# Patient Record
Sex: Female | Born: 1995 | Race: Black or African American | Hispanic: No | Marital: Single | Smoking: Never smoker
Health system: Southern US, Community
[De-identification: ages and names within clinical notes are randomized; demographics above are authoritative.]

## PROBLEM LIST (undated history)

## (undated) DIAGNOSIS — J45909 Unspecified asthma, uncomplicated: Secondary | ICD-10-CM

---

## 2017-08-31 ENCOUNTER — Other Ambulatory Visit: Payer: Self-pay

## 2017-08-31 ENCOUNTER — Emergency Department (HOSPITAL_COMMUNITY)
Admission: EM | Admit: 2017-08-31 | Discharge: 2017-08-31 | Disposition: A | Discharge status: Home / Self Care | Attending: Emergency Medicine | Admitting: Emergency Medicine

## 2017-08-31 ENCOUNTER — Encounter (HOSPITAL_COMMUNITY): Payer: Self-pay | Admitting: Emergency Medicine

## 2017-08-31 ENCOUNTER — Emergency Department (HOSPITAL_COMMUNITY)

## 2017-08-31 DIAGNOSIS — R0789 Other chest pain: Secondary | ICD-10-CM | POA: Diagnosis not present

## 2017-08-31 DIAGNOSIS — R079 Chest pain, unspecified: Secondary | ICD-10-CM

## 2017-08-31 LAB — I-STAT TROPONIN, ED: Troponin i, poc: 0 ng/mL (ref 0.00–0.08)

## 2017-08-31 LAB — I-STAT BETA HCG BLOOD, ED (MC, WL, AP ONLY): I-stat hCG, quantitative: <5 m[IU]/mL (ref <5)

## 2017-08-31 MED ORDER — OMEPRAZOLE 20 MG PO CPDR: 20.0000 mg | DELAYED_RELEASE_CAPSULE | Freq: Every day | ORAL | 0 refills | Status: DC

## 2017-08-31 NOTE — ED Notes (Signed)
Bed: WLPT1 Expected date:  Expected time:  Means of arrival:  Comments: 

## 2017-08-31 NOTE — ED Provider Notes (Signed)
7 date after I called [facility]-EMERGENCY DEPT Provider Note   CSN: [id number] Arrival date & time: 08/31/17  1447     History   Chief Complaint Chief Complaint  Patient presents with  . Chest Pain    HPI Charlotte Mcdaniel is a 22 y.o. female.  HPI Patient presents with chest pain.  Has had for the last 2 days.  Worse with laying down.  States it feels like it is up in her throat.  Also feels as if it is below her breasts.  No pain with exertion.  No fevers.  No cough.  No swelling in her legs.  No recent infection.  She is on a Nexplanon implant.  No lightheadedness or dizziness.  Has not had pains like this before.  No family history of early cardiac disease.  No swelling and his legs. History reviewed. No pertinent past medical history.  There are no active problems to display for this patient.   History reviewed. No pertinent surgical history.  OB History    No data available       Home Medications    Prior to Admission medications   Medication Sig Start Date End Date Taking? Authorizing Provider  etonogestrel (NEXPLANON) 68 MG IMPL implant 1 each by Subdermal route once.   Yes [provider]  ibuprofen (ADVIL,MOTRIN) 200 MG tablet Take 400 mg by mouth daily as needed.   Yes [provider]  omeprazole (PRILOSEC) 20 MG capsule Take 1 capsule (20 mg total) by mouth daily. 08/31/17   Benjiman CorePickering, Eberardo Demello, MD    Family History No family history on file.  Social History Social History   Tobacco Use  . Smoking status: Not on file  Substance Use Topics  . Alcohol use: Not on file  . Drug use: Not on file     Allergies   Patient has no known allergies.   Review of Systems Review of Systems  Constitutional: Negative for appetite change.  HENT: Negative for congestion.   Respiratory: Negative for shortness of breath.   Cardiovascular: Positive for chest pain.  Gastrointestinal: Negative for abdominal pain.  Genitourinary:  Negative for flank pain.  Musculoskeletal: Negative for back pain.  Neurological: Negative for seizures.  Psychiatric/Behavioral: Negative for confusion.     Physical Exam Updated Vital Signs BP 107/73 (BP Location: Right Arm)   Pulse 61   Temp 98.2 F (36.8 C) (Oral)   Resp 18   SpO2 100%   Physical Exam  Constitutional: She appears well-developed and well-nourished.  Non-toxic appearance. She does not appear ill.  HENT:  Head: Atraumatic.  Eyes: Pupils are equal, round, and reactive to light.  Neck: Neck supple.  Cardiovascular: Normal rate, regular rhythm and normal pulses. Exam reveals no gallop and no friction rub.  No murmur heard. Pulmonary/Chest: Effort normal and breath sounds normal.  Abdominal: There is no tenderness.  Musculoskeletal:       Right lower leg: She exhibits no edema.       Left lower leg: She exhibits no edema.  Neurological: She is alert.  Skin: Skin is warm. Capillary refill takes less than 2 seconds.  Psychiatric: She has a normal mood and affect.     ED Treatments / Results  Labs (all labs ordered are listed, but only abnormal results are displayed) Labs Reviewed  I-STAT TROPONIN, ED  I-STAT BETA HCG BLOOD, ED ([facility], [facility], [facility] ONLY)    EKG  EKG Interpretation  Date/Time:  Friday August 31 2017 14:57:29 EST Ventricular Rate:  75 PR Interval:    QRS Duration: 77 QT Interval:  363 QTC Calculation: 406 R Axis:   53 Text Interpretation:  Sinus rhythm Baseline wander in lead(s) V2 No old tracing to compare Confirmed by Shaune Pollack [phone]) on 08/31/2017 3:43:14 PM Also confirmed by Benjiman Core [phone])  on 08/31/2017 4:12:54 PM       Radiology Dg Chest 2 View  Result Date: 08/31/2017 CLINICAL DATA:  Chest pain for 2 days. EXAM: CHEST  2 VIEW COMPARISON:  None. FINDINGS: The heart size and mediastinal contours are within normal limits. Both lungs are clear. The visualized skeletal structures are unremarkable. IMPRESSION: No active  cardiopulmonary disease. Electronically Signed   By: Elsie Stain M.D.   On: 08/31/2017 15:13    Procedures Procedures (including critical care time)  Medications Ordered in ED Medications - No data to display   Initial Impression / Assessment and Plan / ED Course  I have reviewed the triage vital signs and the nursing notes.  Pertinent labs & imaging results that were available during my care of the patient were reviewed by me and considered in my medical decision making (see chart for details).     Patient with chest pain.  Dull in mid chest.  Worse with lying back improved by sitting up.  EKG reassuring.  No rub.  Negative troponin.  Negative x-ray.  Will discharge home.  Doubt cardiac ischemia.  Doubt pulmonary embolism.  Pericarditis considered  with the worsening laying back and improvement leaning forward.  Also reflux considered.  Will give anti-inflammatories and Prilosec and follow-up as needed.  Final Clinical Impressions(s) / ED Diagnoses   Final diagnoses:  Nonspecific chest pain    ED Discharge Orders        Ordered    omeprazole (PRILOSEC) 20 MG capsule  Daily     08/31/17 Marland Kitchen, MD 08/31/17 1851

## 2017-08-31 NOTE — Discharge Instructions (Signed)
Take some ibuprofen the next few days and see if that will help with the pain.  If pain does not improve may need further follow-up.  Potentially even with cardiology since the pain is worse with leaning forward.

## 2017-08-31 NOTE — ED Triage Notes (Signed)
Pt complaint of mid chest pain worse with laying down and breathing for 2 days; denies recent sickness.

## 2018-02-14 ENCOUNTER — Encounter (HOSPITAL_COMMUNITY): Payer: Self-pay | Admitting: Emergency Medicine

## 2018-02-14 ENCOUNTER — Ambulatory Visit (HOSPITAL_COMMUNITY)
Admission: EM | Admit: 2018-02-14 | Discharge: 2018-02-14 | Disposition: A | Discharge status: Home / Self Care | Attending: Family Medicine | Admitting: Family Medicine

## 2018-02-14 ENCOUNTER — Ambulatory Visit (INDEPENDENT_AMBULATORY_CARE_PROVIDER_SITE_OTHER)

## 2018-02-14 DIAGNOSIS — R1013 Epigastric pain: Secondary | ICD-10-CM

## 2018-02-14 DIAGNOSIS — R0789 Other chest pain: Secondary | ICD-10-CM

## 2018-02-14 MED ORDER — OMEPRAZOLE 20 MG PO CPDR: 20.0000 mg | DELAYED_RELEASE_CAPSULE | Freq: Every day | ORAL | 0 refills | Status: DC

## 2018-02-14 MED ORDER — OMEPRAZOLE 20 MG PO CPDR: 20.0000 mg | DELAYED_RELEASE_CAPSULE | Freq: Every day | ORAL | 0 refills | Status: AC

## 2018-02-14 NOTE — ED Notes (Signed)
Bed: UC01 Expected date: 02/14/18 Expected time:  Means of arrival:  Comments: Hold for APPTS

## 2018-02-14 NOTE — Discharge Instructions (Signed)
Begin prilosec daily for the next 2 weeks  Try gas-x over the counter  We will check the stool for infectious causes

## 2018-02-14 NOTE — ED Triage Notes (Signed)
Pt here for upper abd pain that feels like gas worse when laying flat x 1 week

## 2018-02-14 NOTE — ED Provider Notes (Signed)
[facility]    CSN: [id number] Arrival date & time: 02/14/18  1645     History   Chief Complaint Chief Complaint  Patient presents with  . Abdominal Pain    HPI Charlotte Mcdaniel is a 22 y.o. 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  Patient feels like there is a gas bubble in her upper abdomen, below her bra line that wraps around bilateral sides.  There is associated shortness of breath.  Also feels a sensation of a possible dog hair in her throat.  States she works at a [employer].  States that she notices this sensation more so when she is lying flat.  But will also occur when she is sitting upright.  Will recover from sleep.  She has had some nausea and some diarrhea, but has related this to her last menstrual cycle began on 7/12.  She states that she recently had the Nexplanon removed from her right upper arm in May.  Patient without discomfort at time of visit.  Last episode was yesterday.  HPI  History reviewed. No pertinent past medical history.  There are no active problems to display for this patient.   History reviewed. No pertinent surgical history.  OB History   None      Home Medications    Prior to Admission medications   Medication Sig Start Date End Date Taking? Authorizing Provider  etonogestrel (NEXPLANON) 68 MG IMPL implant 1 each by Subdermal route once.    [provider]  ibuprofen (ADVIL,MOTRIN) 200 MG tablet Take 400 mg by mouth daily as needed.    [provider]  omeprazole (PRILOSEC) 20 MG capsule Take 1 capsule (20 mg total) by mouth daily. 02/14/18   Saabir Blyth, Junius Creamer, PA-C    Family History History reviewed. No pertinent family history.  Social History Social History   Tobacco Use  . Smoking status: Not on file  Substance Use Topics  . Alcohol use: Not on file  . Drug use: Not on file     Allergies   Patient has no known  allergies.   Review of Systems Review of Systems  Constitutional: Negative for fatigue and fever.  HENT: Negative for congestion, sinus pressure and sore throat.   Eyes: Negative for photophobia, pain and visual disturbance.  Respiratory: Negative for cough and shortness of breath.   Cardiovascular: Positive for chest pain.  Gastrointestinal: Positive for abdominal pain, diarrhea and nausea. Negative for vomiting.  Genitourinary: Negative for decreased urine volume and hematuria.  Musculoskeletal: Negative for myalgias, neck pain and neck stiffness.  Neurological: Negative for dizziness, syncope, facial asymmetry, speech difficulty, weakness, light-headedness, numbness and headaches.     Physical Exam Triage Vital Signs ED Triage Vitals [02/14/18 1735]  Enc Vitals Group     BP 113/61     Pulse Rate 79     Resp 18     Temp 97.8 F (36.6 C)     Temp Source Oral     SpO2 98 %     Weight      Height      Head Circumference      Peak Flow      Pain Score      Pain Loc      Pain Edu?      Excl. in GC?    No data found.  Updated Vital Signs BP 113/61 (BP Location: Left Arm)   Pulse 79  Temp 97.8 F (36.6 C) (Oral)   Resp 18   SpO2 98%   Visual Acuity Right Eye Distance:   Left Eye Distance:   Bilateral Distance:    Right Eye Near:   Left Eye Near:    Bilateral Near:     Physical Exam  Constitutional: She is oriented to person, place, and time. She appears well-developed and well-nourished. No distress.  No acute distress  HENT:  Head: Normocephalic and atraumatic.  Nose: Nose normal.  Mouth/Throat: Oropharynx is clear and moist.  Eyes: Conjunctivae are normal.  Neck: Neck supple.  Cardiovascular: Normal rate and regular rhythm.  No murmur heard. Pulmonary/Chest: Effort normal and breath sounds normal. No respiratory distress.  Breathing comfortably at rest, CTABL, no wheezing, rales or other adventitious sounds auscultated  Abdominal: Soft. She exhibits  no distension. There is no tenderness.  Nontender light deep palpation throughout all 4 quadrants and epigastrium  Musculoskeletal: Normal range of motion. She exhibits no edema.  Chest discomfort not reproducible with palpation across anterior chest  Neurological: She is alert and oriented to person, place, and time.  Skin: Skin is warm and dry.  Psychiatric: She has a normal mood and affect.  Nursing note and vitals reviewed.    UC Treatments / Results  Labs (all labs ordered are listed, but only abnormal results are displayed) Labs Reviewed - No data to display  EKG None  Radiology Dg Chest 2 View  Result Date: 02/14/2018 CLINICAL DATA:  Shortness of Breath EXAM: CHEST - 2 VIEW COMPARISON:  08/31/2017 FINDINGS: Heart and mediastinal contours are within normal limits. No focal opacities or effusions. No acute bony abnormality. No pneumothorax. IMPRESSION: No active cardiopulmonary disease. Electronically Signed   By: Charlett Nose M.D.   On: 02/14/2018 18:15    Procedures Procedures (including critical care time)  Medications Ordered in UC Medications - No data to display  Initial Impression / Assessment and Plan / UC Course  I have reviewed the triage vital signs and the nursing notes.  Pertinent labs & imaging results that were available during my care of the patient were reviewed by me and considered in my medical decision making (see chart for details).     Chest x-ray negative for causes of discomfort, did show some air from splenic flexure and more distally in the colon, EKG and normal sinus, no acute changes from previous EKGs.  Discussed GI cocktail, the patient without pain at time of visit.  Discussed doing trial of reflux medicine as this was presented to her previously and she did not take the medicine.  Will restart Prilosec to take consistently over the next 2 weeks.  Discussed may also try OTC gas reliever.  Follow-up if symptoms worsening or not getting better  with consistent use of PPI.Discussed strict return precautions. Patient verbalized understanding and is agreeable with plan.  Final Clinical Impressions(s) / UC Diagnoses   Final diagnoses:  Epigastric pain     Discharge Instructions     Begin prilosec daily for the next 2 weeks  Try gas-x over the counter  We will check the stool for infectious causes    ED Prescriptions    Medication Sig Dispense Auth. Provider   omeprazole (PRILOSEC) 20 MG capsule  (Status: Discontinued) Take 1 capsule (20 mg total) by mouth daily. 14 capsule Courtny Bennison C, PA-C   omeprazole (PRILOSEC) 20 MG capsule Take 1 capsule (20 mg total) by mouth daily. 14 capsule Anilah Huck, [place] C, PA-C  Controlled Substance Prescriptions [place] Controlled Substance Registry consulted? Not Applicable   Lew DawesWieters, Kathan Kirker C, [place] 02/15/18 [phone]

## 2018-04-28 ENCOUNTER — Encounter (HOSPITAL_COMMUNITY): Payer: Self-pay | Admitting: *Deleted

## 2018-04-28 ENCOUNTER — Emergency Department (HOSPITAL_COMMUNITY)
Admission: EM | Admit: 2018-04-28 | Discharge: 2018-04-28 | Disposition: A | Discharge status: Home / Self Care | Attending: Emergency Medicine | Admitting: Emergency Medicine

## 2018-04-28 ENCOUNTER — Other Ambulatory Visit: Payer: Self-pay

## 2018-04-28 DIAGNOSIS — R52 Pain, unspecified: Secondary | ICD-10-CM | POA: Insufficient documentation

## 2018-04-28 DIAGNOSIS — Z5321 Procedure and treatment not carried out due to patient leaving prior to being seen by health care provider: Secondary | ICD-10-CM | POA: Insufficient documentation

## 2018-04-28 DIAGNOSIS — R509 Fever, unspecified: Secondary | ICD-10-CM | POA: Insufficient documentation

## 2018-04-28 MED ORDER — ACETAMINOPHEN 325 MG PO TABS
650.0000 mg | ORAL_TABLET | Freq: Once | ORAL | Status: AC
  Administered 2018-04-28: 650 mg via ORAL
  Filled 2018-04-28: qty 2

## 2018-04-28 NOTE — ED Notes (Signed)
Pt gave her labels to registration and decided to leave

## 2018-04-28 NOTE — ED Triage Notes (Signed)
Generalized body aches, fevers, headache since Friday night. Last ibuprofen at midnight last night.

## 2018-04-29 ENCOUNTER — Encounter (HOSPITAL_COMMUNITY): Payer: Self-pay | Admitting: Emergency Medicine

## 2018-04-29 ENCOUNTER — Ambulatory Visit (HOSPITAL_COMMUNITY)
Admission: EM | Admit: 2018-04-29 | Discharge: 2018-04-29 | Disposition: A | Discharge status: Home / Self Care | Attending: Family Medicine | Admitting: Family Medicine

## 2018-04-29 DIAGNOSIS — J309 Allergic rhinitis, unspecified: Secondary | ICD-10-CM | POA: Insufficient documentation

## 2018-04-29 DIAGNOSIS — J029 Acute pharyngitis, unspecified: Secondary | ICD-10-CM | POA: Insufficient documentation

## 2018-04-29 DIAGNOSIS — Z833 Family history of diabetes mellitus: Secondary | ICD-10-CM | POA: Diagnosis not present

## 2018-04-29 LAB — POCT RAPID STREP A: Streptococcus, Group A Screen (Direct): NEGATIVE

## 2018-04-29 MED ORDER — FLUTICASONE PROPIONATE 50 MCG/ACT NA SUSP: 1.0000 | Freq: Every day | NASAL | 0 refills | Status: DC

## 2018-04-29 MED ORDER — OLOPATADINE HCL 0.1 % OP SOLN: 1.0000 [drp] | Freq: Two times a day (BID) | OPHTHALMIC | 0 refills | Status: AC

## 2018-04-29 MED ORDER — CETIRIZINE HCL 10 MG PO CAPS: 10.0000 mg | ORAL_CAPSULE | Freq: Every day | ORAL | 0 refills | Status: AC

## 2018-04-29 NOTE — ED Triage Notes (Signed)
Pt c/o body aches, fever, headache, flu like symptoms since Friday.

## 2018-04-29 NOTE — Discharge Instructions (Signed)
Your symptoms are most likely viral or related to allergies For congestion please begin daily allergy pill like Zyrtec or Claritin, I sent in the generic version of Zyrtec, please also use Flonase nasal spray, 1 to 2 sprays in each nostril daily  Please use olopatadine eyedrops in both eyes twice daily to help with eye discomfort  Please continue Tylenol and ibuprofen as needed for any headache  Please follow-up if symptoms worsening, developing fever, persistent symptoms beyond 1 week without any improvement using the above, shortness of breath, chest discomfort

## 2018-04-29 NOTE — ED Notes (Signed)
Bed: UC01 Expected date: 04/29/18 Expected time:  Means of arrival:  Comments: For APPTS 

## 2018-04-30 NOTE — ED Provider Notes (Signed)
[facility]    CSN: [id number] Arrival date & time: 04/29/18  1032     History   Chief Complaint Chief Complaint  Patient presents with  . Appointment    1030  . Flu Like Symptoms    HPI Charlotte Mcdaniel is a 22 y.o. female no contributing past medical history, Patient is presenting with URI symptoms- congestion, cough, sore throat.  Patient is also having headache, watery eyes, ear pressure and sneezing.  Symptoms worsen at nighttime and in the morning.  Patient's main complaints are headache and sore throat. Symptoms have been going on for 3 to 4 days. Patient has tried occasional allergy pills, Tylenol, with minimal relief. Denies fever, nausea, vomiting, diarrhea. Denies shortness of breath and chest pain.    HPI  History reviewed. No pertinent past medical history.  There are no active problems to display for this patient.   History reviewed. No pertinent surgical history.  OB History   None      Home Medications    Prior to Admission medications   Medication Sig Start Date End Date Taking? Authorizing Provider  Cetirizine HCl 10 MG CAPS Take 1 capsule (10 mg total) by mouth daily for 10 days. 04/29/18 05/09/18  Marchell Froman C, PA-C  etonogestrel (NEXPLANON) 68 MG IMPL implant 1 each by Subdermal route once.    [provider]  fluticasone (FLONASE) 50 MCG/ACT nasal spray Place 1-2 sprays into both nostrils daily for 7 days. 04/29/18 05/06/18  Eternity Dexter C, PA-C  ibuprofen (ADVIL,MOTRIN) 200 MG tablet Take 400 mg by mouth daily as needed.    [provider]  olopatadine (PATANOL) 0.1 % ophthalmic solution Place 1 drop into both eyes 2 (two) times daily. 04/29/18   Alexsis Branscom C, PA-C  omeprazole (PRILOSEC) 20 MG capsule Take 1 capsule (20 mg total) by mouth daily. 02/14/18   Jhordan Kinter, Junius Creamer, PA-C    Family History Family History  Problem Relation Age of Onset  . Diabetes Mother     Social History Social History    Tobacco Use  . Smoking status: Never Smoker  Substance Use Topics  . Alcohol use: Never    Frequency: Never  . Drug use: Never     Allergies   Patient has no known allergies.   Review of Systems Review of Systems  Constitutional: Positive for fatigue. Negative for activity change, appetite change, chills and fever.  HENT: Positive for congestion, rhinorrhea and sore throat. Negative for ear pain, sinus pressure and trouble swallowing.   Eyes: Positive for discharge. Negative for redness.  Respiratory: Positive for cough. Negative for chest tightness and shortness of breath.   Cardiovascular: Negative for chest pain.  Gastrointestinal: Negative for abdominal pain, diarrhea, nausea and vomiting.  Musculoskeletal: Positive for myalgias.  Skin: Negative for rash.  Neurological: Positive for headaches. Negative for dizziness and light-headedness.     Physical Exam Triage Vital Signs ED Triage Vitals [04/29/18 1049]  Enc Vitals Group     BP 96/61     Pulse Rate 77     Resp 16     Temp 98.5 F (36.9 C)     Temp src      SpO2 100 %     Weight      Height      Head Circumference      Peak Flow      Pain Score      Pain Loc      Pain Edu?  Excl. in GC?    No data found.  Updated Vital Signs BP 96/61   Pulse 77   Temp 98.5 F (36.9 C)   Resp 16   LMP 04/07/2018   SpO2 100%   Visual Acuity Right Eye Distance:   Left Eye Distance:   Bilateral Distance:    Right Eye Near:   Left Eye Near:    Bilateral Near:     Physical Exam  Constitutional: She appears well-developed and well-nourished. No distress.  HENT:  Head: Normocephalic and atraumatic.  Bilateral ears without tenderness to palpation of external auricle, tragus and mastoid, EAC's without erythema or swelling, TM's with good bony landmarks and cone of light. Non erythematous.  Oral mucosa pink and moist, no tonsillar enlargement or exudate. Posterior pharynx patent and erythematous, no uvula  deviation or swelling. Normal phonation.  Eyes: Pupils are equal, round, and reactive to light. Conjunctivae and EOM are normal.  Neck: Neck supple.  Cardiovascular: Normal rate and regular rhythm.  No murmur heard. Pulmonary/Chest: Effort normal and breath sounds normal. No respiratory distress.  Breathing comfortably at rest, CTABL, no wheezing, rales or other adventitious sounds auscultated  Abdominal: Soft. There is no tenderness.  Musculoskeletal: She exhibits no edema.  Neurological: She is alert.  Skin: Skin is warm and dry.  Psychiatric: She has a normal mood and affect.  Nursing note and vitals reviewed.    UC Treatments / Results  Labs (all labs ordered are listed, but only abnormal results are displayed) Labs Reviewed  CULTURE, GROUP A STREP [facility])  POCT RAPID STREP A    EKG None  Radiology No results found.  Procedures Procedures (including critical care time)  Medications Ordered in UC Medications - No data to display  Initial Impression / Assessment and Plan / UC Course  I have reviewed the triage vital signs and the nursing notes.  Pertinent labs & imaging results that were available during my care of the patient were reviewed by me and considered in my medical decision making (see chart for details).     Patient with URI symptoms, likely viral versus allergies.  Will treat symptomatically, advised patient to restart her daily allergy pill and take consistently, Flonase nasal spray for congestion, olopatadine eyedrops for eye drainage and discomfort.  Continue Tylenol and ibuprofen as needed for headaches.Discussed strict return precautions. Patient verbalized understanding and is agreeable with plan.  Final Clinical Impressions(s) / UC Diagnoses   Final diagnoses:  Allergic rhinitis, unspecified seasonality, unspecified trigger     Discharge Instructions     Your symptoms are most likely viral or related to allergies For congestion please begin  daily allergy pill like Zyrtec or Claritin, I sent in the generic version of Zyrtec, please also use Flonase nasal spray, 1 to 2 sprays in each nostril daily  Please use olopatadine eyedrops in both eyes twice daily to help with eye discomfort  Please continue Tylenol and ibuprofen as needed for any headache  Please follow-up if symptoms worsening, developing fever, persistent symptoms beyond 1 week without any improvement using the above, shortness of breath, chest discomfort   ED Prescriptions    Medication Sig Dispense Auth. Provider   Cetirizine HCl 10 MG CAPS Take 1 capsule (10 mg total) by mouth daily for 10 days. 15 capsule Jamirra Curnow C, PA-C   fluticasone (FLONASE) 50 MCG/ACT nasal spray Place 1-2 sprays into both nostrils daily for 7 days. 1 g Kashlynn Kundert C, PA-C   olopatadine (PATANOL) 0.1 %  ophthalmic solution Place 1 drop into both eyes 2 (two) times daily. 5 mL Obert Espindola C, PA-C     Controlled Substance Prescriptions [place] Controlled Substance Registry consulted? Not Applicable   Lew Dawes, [place] 04/30/18 1610

## 2018-05-01 LAB — CULTURE, GROUP A STREP (THRC)

## 2018-05-02 ENCOUNTER — Telehealth (HOSPITAL_COMMUNITY): Payer: Self-pay

## 2018-05-02 NOTE — Telephone Encounter (Signed)
Culture is positive for non group A Strep germ.  This is a finding of uncertain significance; not the typical 'strep throat' germ.  Pt reports feeling better. 

## 2019-03-04 IMAGING — DX DG CHEST 2V
2 series · 2 of 2 positions shown · non-contrast
Comparison: 08/31/2017

CLINICAL DATA: Shortness of Breath

EXAM:
CHEST - 2 VIEW

[chest pa]
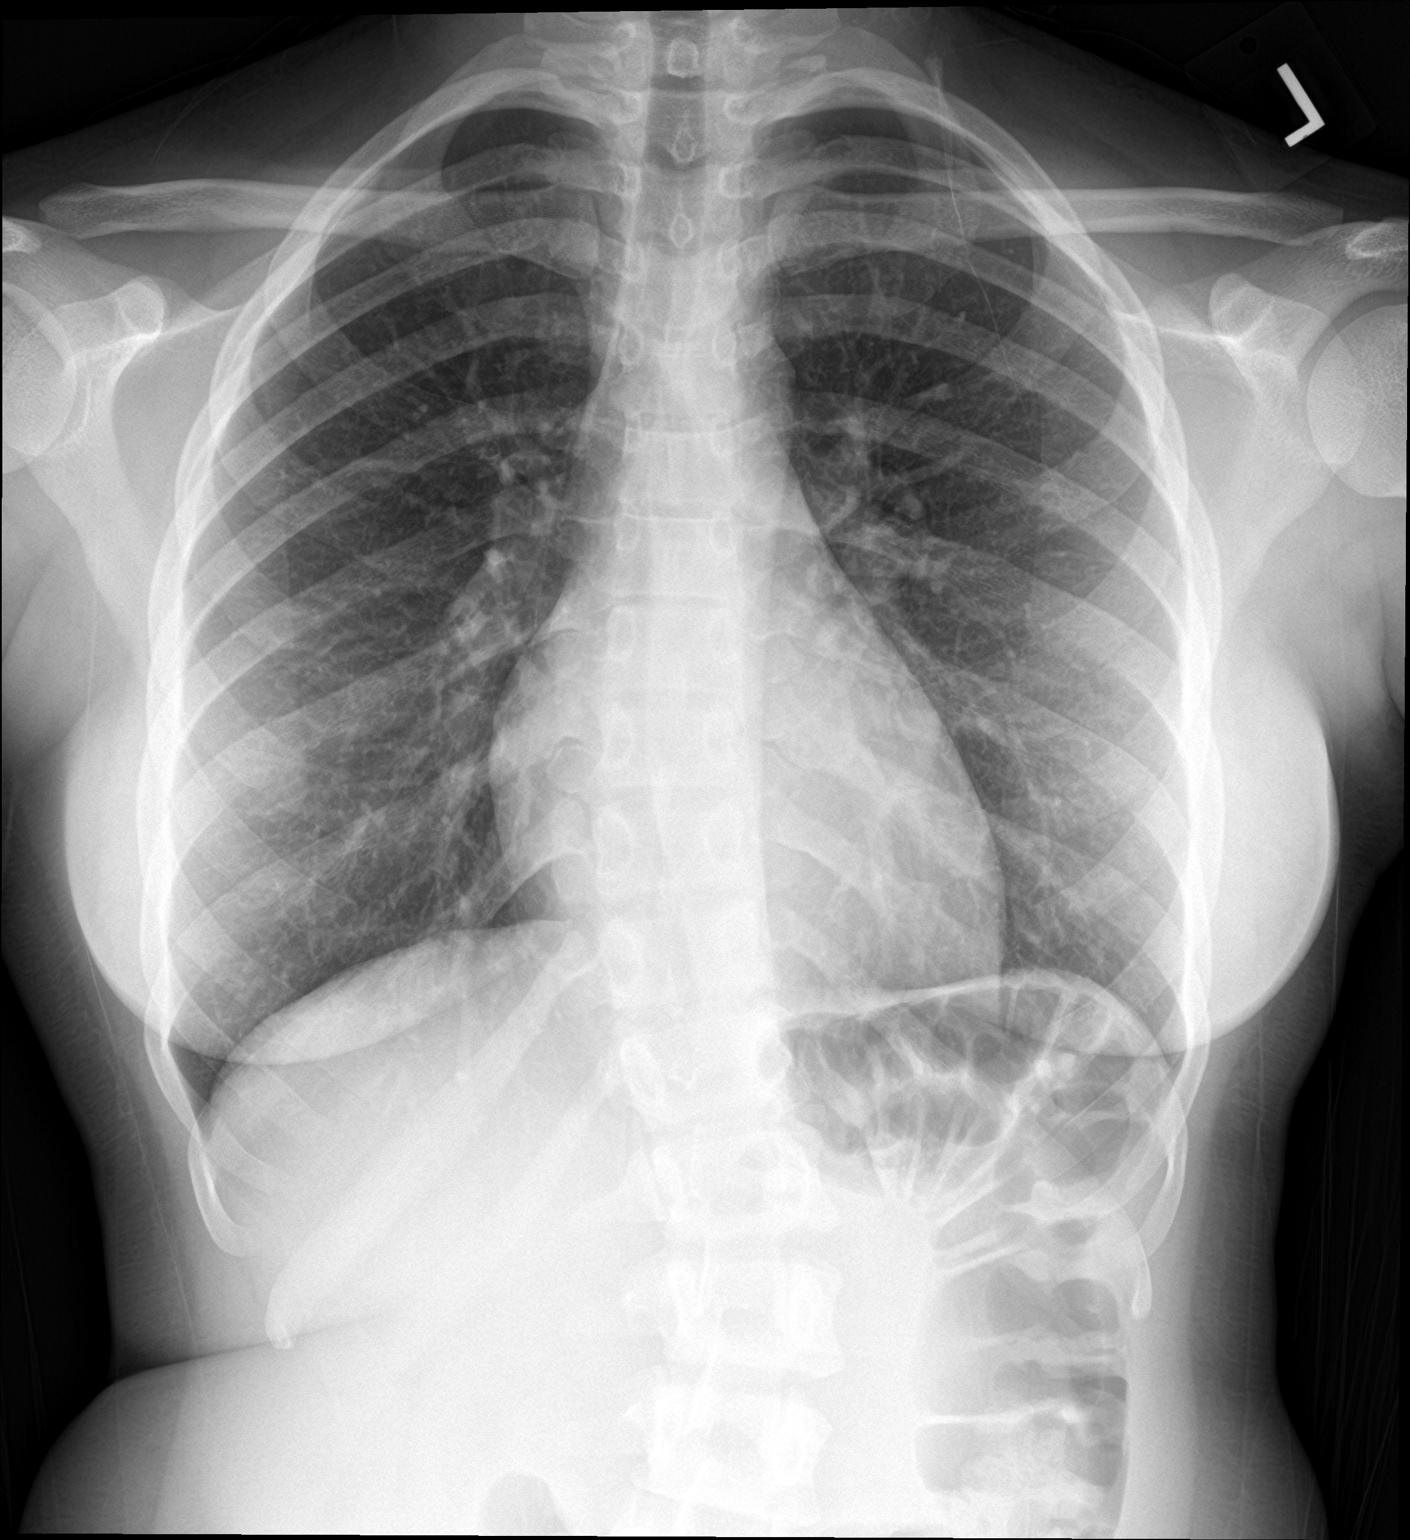

[chest lat]
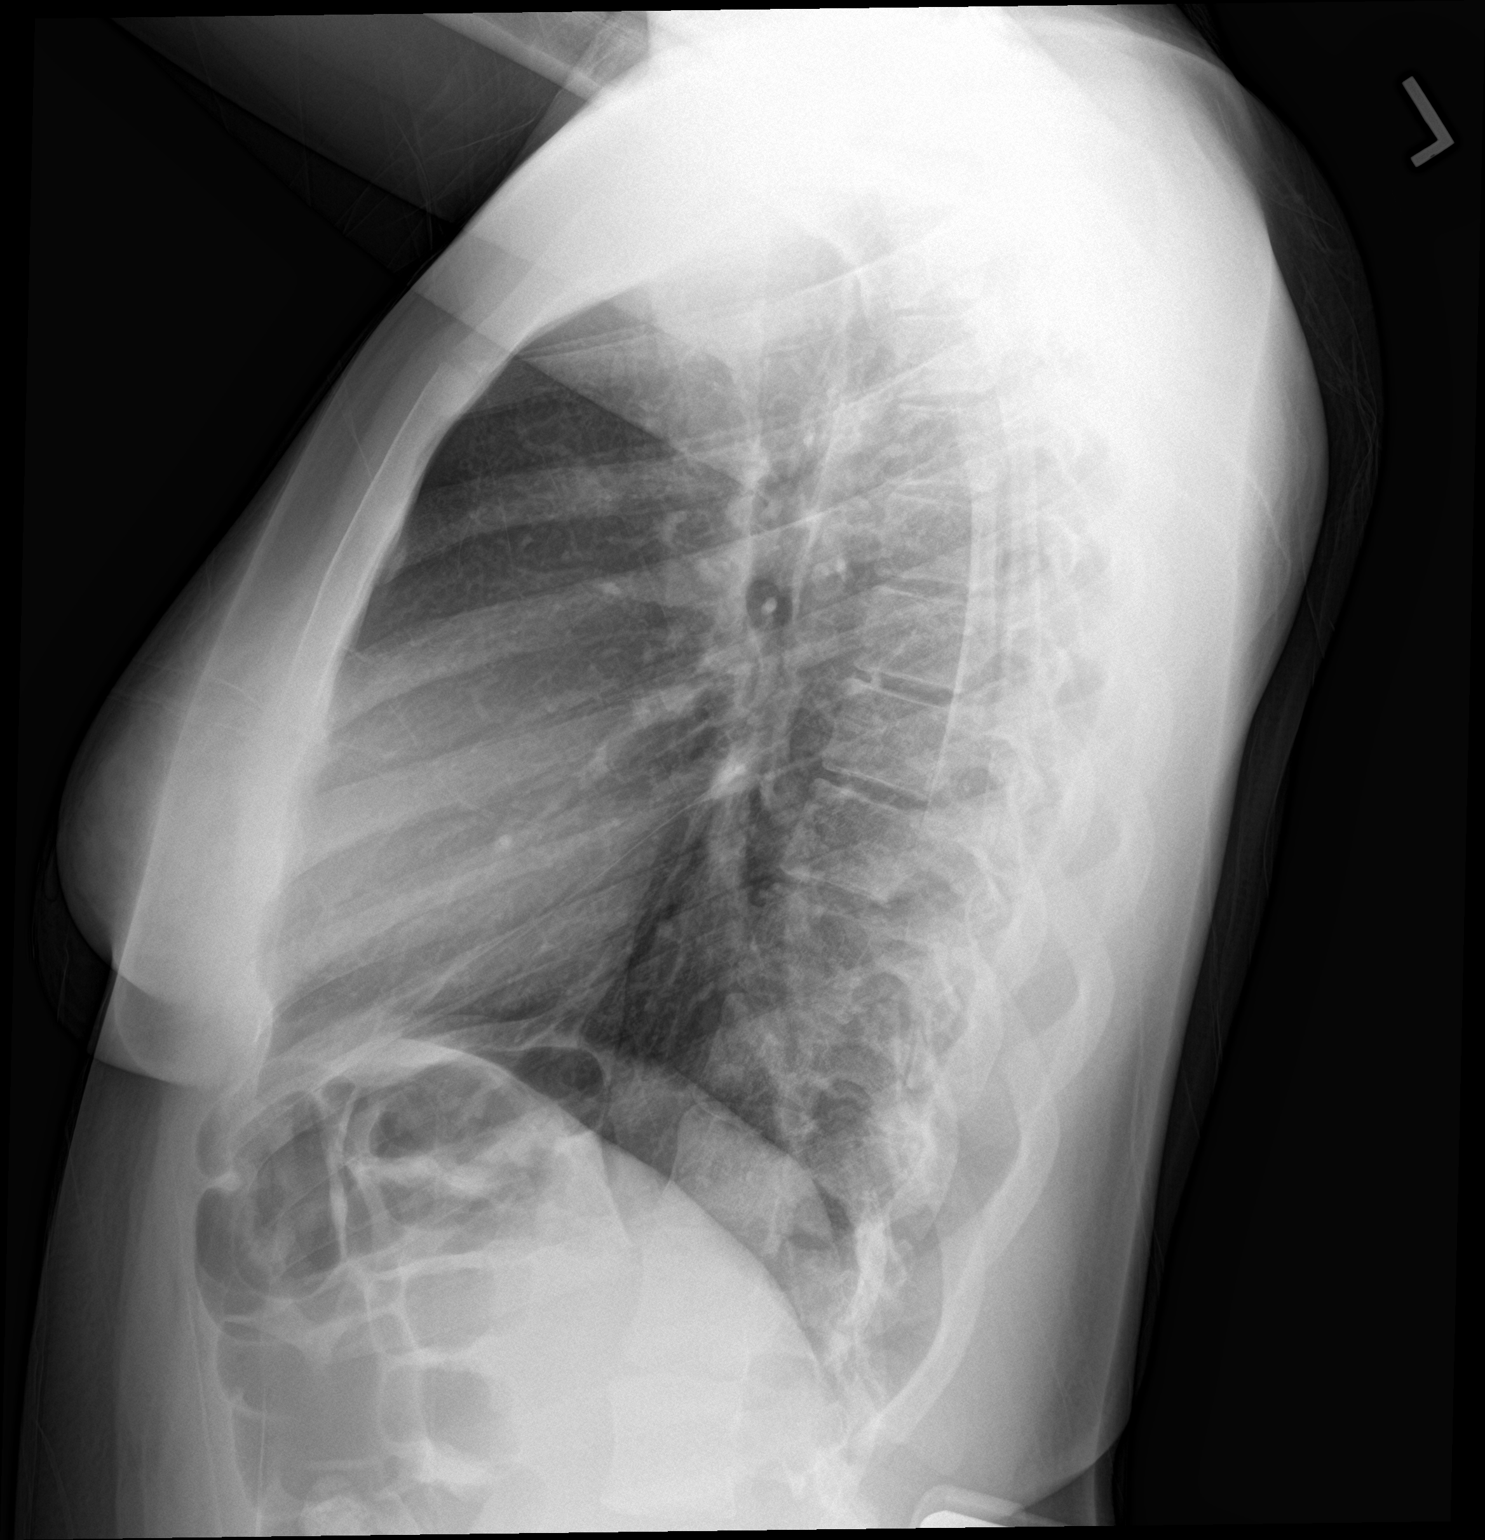

[2 of 2 positions shown; findings below may reference images not displayed]

FINDINGS: Heart and mediastinal contours are within normal limits. No focal
opacities or effusions. No acute bony abnormality. No pneumothorax.
IMPRESSION: No active cardiopulmonary disease.

## 2019-10-18 ENCOUNTER — Ambulatory Visit

## 2019-10-23 ENCOUNTER — Ambulatory Visit: Attending: Family

## 2019-10-23 DIAGNOSIS — Z23 Encounter for immunization: Secondary | ICD-10-CM

## 2019-10-23 NOTE — Progress Notes (Signed)
   Covid-19 Vaccination Clinic  Name:  Charlotte Mcdaniel    MRN: [id number] DOB: 1996/01/25  10/23/2019  [name] was observed post Covid-19 immunization for 15 minutes without incident. She was provided with Vaccine Information Sheet and instruction to access the V-Safe system.   [name] was instructed to call [phone] with any severe reactions post vaccine: Marland Kitchen Difficulty breathing  . Swelling of face and throat  . A fast heartbeat  . A bad rash all over body  . Dizziness and weakness   Immunizations Administered    Name Date Dose VIS Date Route   Moderna COVID-19 Vaccine 10/23/2019  3:46 PM 0.5 mL 07/01/2019 Intramuscular   Manufacturer: Moderna   Lot: [id number]   NDC: 86773-736-68

## 2019-10-30 ENCOUNTER — Encounter (HOSPITAL_COMMUNITY): Payer: Self-pay

## 2019-10-30 ENCOUNTER — Other Ambulatory Visit: Payer: Self-pay

## 2019-10-30 ENCOUNTER — Ambulatory Visit (HOSPITAL_COMMUNITY)
Admission: EM | Admit: 2019-10-30 | Discharge: 2019-10-30 | Disposition: A | Discharge status: Home / Self Care | Attending: Physician Assistant | Admitting: Physician Assistant

## 2019-10-30 DIAGNOSIS — J45901 Unspecified asthma with (acute) exacerbation: Secondary | ICD-10-CM | POA: Diagnosis not present

## 2019-10-30 DIAGNOSIS — J302 Other seasonal allergic rhinitis: Secondary | ICD-10-CM | POA: Diagnosis not present

## 2019-10-30 HISTORY — DX: Unspecified asthma, uncomplicated: J45.909

## 2019-10-30 MED ORDER — ALBUTEROL SULFATE HFA 108 (90 BASE) MCG/ACT IN AERS: 2.0000 | INHALATION_SPRAY | RESPIRATORY_TRACT | 0 refills | Status: AC | PRN

## 2019-10-30 MED ORDER — ALBUTEROL SULFATE HFA 108 (90 BASE) MCG/ACT IN AERS
2.0000 | INHALATION_SPRAY | Freq: Once | RESPIRATORY_TRACT | Status: AC
  Administered 2019-10-30: 19:00:00 2 via RESPIRATORY_TRACT

## 2019-10-30 MED ORDER — FLUTICASONE PROPIONATE 50 MCG/ACT NA SUSP: 1.0000 | Freq: Every day | NASAL | 0 refills | Status: AC

## 2019-10-30 MED ORDER — ALBUTEROL SULFATE HFA 108 (90 BASE) MCG/ACT IN AERS
INHALATION_SPRAY | RESPIRATORY_TRACT | Status: AC
  Filled 2019-10-30: qty 6.7

## 2019-10-30 MED ORDER — SALINE SPRAY 0.65 % NA SOLN: 1.0000 | NASAL | 0 refills | Status: AC | PRN

## 2019-10-30 MED ORDER — AEROCHAMBER PLUS FLO-VU MEDIUM MISC
1.0000 | Freq: Once | Status: AC
  Administered 2019-10-30: 19:00:00 1

## 2019-10-30 MED ORDER — AEROCHAMBER PLUS FLO-VU LARGE MISC
Status: AC
  Filled 2019-10-30: qty 1

## 2019-10-30 NOTE — ED Triage Notes (Signed)
Pt c/o productive cough w/clear sputum, nasal congestion, left eye has been getting crust  It. Pt states she has seasonal allergies and she no longer has a nebulizer machine or the meds for it. Pt was told by ins co she needs a script for it. Pt denies SOB. Pt has non labored breathing. Pt has inspiratory wheezes on right upper and right lower lobes of lungs.

## 2019-10-30 NOTE — ED Provider Notes (Signed)
[place]    CSN: [id number] Arrival date & time: 10/30/19  1826      History   Chief Complaint Chief Complaint  Patient presents with  . Cough    HPI Charlotte Mcdaniel is a 24 y.o. female.   Patient with history of asthma reports for complaint of nasal congestion, cough and possible asthma issue.  She reports for about a week she has had allergy symptoms with nasal congestion with runny nose, itchy eyes,  watery eyes that is now progressed to having a slight cough.  She reports she feels the cough is from nasal congestion runny back of her throat.  She reports she start taking Zyrtec when the symptoms started and has had general improvement with regard to eye itching but continues to have some nasal congestion.  sHe did have some eye crusting about a week ago but that has resolved.  She also reports some chest tightness and not being to take a deep breath.  She denies chest pain.  Denies fever, chills, headache, sore throat, ear pain, nausea, vomiting, diarrhea.  She denies change in taste or smell.   Was initially requesting nebulizer however we discussed that MDI is superior for adults.     Past Medical History:  Diagnosis Date  . Asthma     There are no problems to display for this patient.   History reviewed. No pertinent surgical history.  OB History   No obstetric history on file.      Home Medications    Prior to Admission medications   Medication Sig Start Date End Date Taking? Authorizing Provider  albuterol (VENTOLIN HFA) 108 (90 Base) MCG/ACT inhaler Inhale 2 puffs into the lungs every 4 (four) hours as needed for wheezing or shortness of breath. 10/30/19   Filippa Yarbough, Marguerita Beards, PA-C  Cetirizine HCl 10 MG CAPS Take 1 capsule (10 mg total) by mouth daily for 10 days. 04/29/18 05/09/18  Wieters, Hallie C, PA-C  etonogestrel (NEXPLANON) 68 MG IMPL implant 1 each by Subdermal route once.    [provider]  fluticasone (FLONASE) 50 MCG/ACT nasal  spray Place 1 spray into both nostrils daily for 14 days. 10/30/19 11/13/19  Marionette Meskill, Marguerita Beards, PA-C  ibuprofen (ADVIL,MOTRIN) 200 MG tablet Take 400 mg by mouth daily as needed.    [provider]  olopatadine (PATANOL) 0.1 % ophthalmic solution Place 1 drop into both eyes 2 (two) times daily. 04/29/18   Wieters, Hallie C, PA-C  omeprazole (PRILOSEC) 20 MG capsule Take 1 capsule (20 mg total) by mouth daily. 02/14/18   Wieters, Hallie C, PA-C  sodium chloride (OCEAN) 0.65 % SOLN nasal spray Place 1 spray into both nostrils as needed for congestion. 10/30/19   Marget Outten, Marguerita Beards, PA-C    Family History Family History  Problem Relation Age of Onset  . Diabetes Mother     Social History Social History   Tobacco Use  . Smoking status: Never Smoker  Substance Use Topics  . Alcohol use: Never  . Drug use: Never     Allergies   Patient has no known allergies.   Review of Systems Review of Systems  Constitutional: Negative for chills and fever.  HENT: Positive for congestion, postnasal drip, rhinorrhea and sneezing. Negative for ear pain, sinus pressure, sinus pain and sore throat.   Eyes: Positive for itching. Negative for pain and visual disturbance.  Respiratory: Positive for chest tightness and wheezing. Negative for cough and shortness of breath.   Cardiovascular:  Negative for chest pain and palpitations.  Gastrointestinal: Negative for abdominal pain and vomiting.  Genitourinary: Negative for dysuria and hematuria.  Musculoskeletal: Negative for arthralgias and back pain.  Skin: Negative for color change and rash.  Neurological: Negative for seizures, syncope and headaches.  All other systems reviewed and are negative.    Physical Exam Triage Vital Signs ED Triage Vitals  Enc Vitals Group     BP 10/30/19 1855 (!) 116/56     Pulse Rate 10/30/19 1855 78     Resp 10/30/19 1855 16     Temp 10/30/19 1855 98.7 F (37.1 C)     Temp Source 10/30/19 1855 Oral     SpO2 10/30/19  1855 96 %     Weight 10/30/19 1856 140 lb (63.5 kg)     Height 10/30/19 1856 5\' 3"  (1.6 m)     Head Circumference --      Peak Flow --      Pain Score 10/30/19 1855 0     Pain Loc --      Pain Edu? --      Excl. in GC? --    No data found.  Updated Vital Signs BP (!) 116/56   Pulse 78   Temp 98.7 F (37.1 C) (Oral)   Resp 16   Ht 5\' 3"  (1.6 m)   Wt 140 lb (63.5 kg)   SpO2 96%   BMI 24.80 kg/m   Visual Acuity Right Eye Distance:   Left Eye Distance:   Bilateral Distance:    Right Eye Near:   Left Eye Near:    Bilateral Near:     Physical Exam Vitals and nursing note reviewed.  Constitutional:      General: She is not in acute distress.    Appearance: She is well-developed. She is not ill-appearing.  HENT:     Head: Normocephalic and atraumatic.     Right Ear: Tympanic membrane normal.     Left Ear: Tympanic membrane normal.     Nose:     Comments: Boggy pale turbinates with clear nasal discharge    Mouth/Throat:     Mouth: Mucous membranes are moist.     Comments: Nasal drip visible Eyes:     Extraocular Movements: Extraocular movements intact.     Conjunctiva/sclera: Conjunctivae normal.     Pupils: Pupils are equal, round, and reactive to light.  Cardiovascular:     Rate and Rhythm: Normal rate and regular rhythm.     Heart sounds: No murmur.  Pulmonary:     Effort: Pulmonary effort is normal. No respiratory distress.     Breath sounds: Wheezing (Initial inspiratory wheeze in the right middle and right lower lobes however this does clear with cough.  These are possible consideration of rhonchi) present.  Abdominal:     Palpations: Abdomen is soft.     Tenderness: There is no abdominal tenderness.  Musculoskeletal:     Cervical back: Neck supple.     Right lower leg: No edema.     Left lower leg: No edema.  Skin:    General: Skin is warm and dry.     Findings: No rash.  Neurological:     Mental Status: She is alert.      UC Treatments / Results   Labs (all labs ordered are listed, but only abnormal results are displayed) Labs Reviewed - No data to display  EKG   Radiology No results found.  Procedures Procedures (including critical care  time)  Medications Ordered in UC Medications  albuterol (VENTOLIN HFA) 108 (90 Base) MCG/ACT inhaler 2 puff (2 puffs Inhalation Given 10/30/19 1920)  AeroChamber Plus Flo-Vu Medium MISC 1 each (1 each Other Given 10/30/19 1920)    Initial Impression / Assessment and Plan / UC Course  I have reviewed the triage vital signs and the nursing notes.  Pertinent labs & imaging results that were available during my care of the patient were reviewed by me and considered in my medical decision making (see chart for details).     #Allergic rhinitis #Mild asthma Patient 24 year old female presenting with allergic rhinitis with possible mild asthma exacerbation.  She did have inspiratory wheezing however this does clear with cough which is an odd presentation and could represent rhonchi.  Saturating well not in respiratory distress.  She is afebrile and nontachycardic so low suspicion for infection.  Given history of asthma likely this is mild asthma flare related to allergies.  Will discharge with albuterol inhaler with spacer, Flonase and to continue Zyrtec.  Return precautions emergency department precautions discussed patient verbalized understanding.  Patient to follow-up with primary care for continued monitoring of her asthma.   Final Clinical Impressions(s) / UC Diagnoses   Final diagnoses:  Seasonal allergic rhinitis, unspecified trigger  Mild asthma with exacerbation, unspecified whether persistent     Discharge Instructions     Use the albuterol inhaler with spacer every 4 hours as needed for chest tightness and wheezing Begin the Flonase daily during allergy season Continue the Zyrtec Use the nasal saline spray as much as you like throughout the day  If You have severe shortness of  breath I like for you to go to the emergency department      ED Prescriptions    Medication Sig Dispense Auth. Provider   fluticasone (FLONASE) 50 MCG/ACT nasal spray Place 1 spray into both nostrils daily for 14 days. 1 g Rontae Inglett, Veryl Speak, PA-C   sodium chloride (OCEAN) 0.65 % SOLN nasal spray Place 1 spray into both nostrils as needed for congestion. 44 mL Dael Howland, Veryl Speak, PA-C   albuterol (VENTOLIN HFA) 108 (90 Base) MCG/ACT inhaler Inhale 2 puffs into the lungs every 4 (four) hours as needed for wheezing or shortness of breath. 8 g Jahfari Ambers, Veryl Speak, PA-C     PDMP not reviewed this encounter.   Hermelinda Medicus, PA-C 10/30/19 1925

## 2019-10-30 NOTE — Discharge Instructions (Addendum)
Use the albuterol inhaler with spacer every 4 hours as needed for chest tightness and wheezing Begin the Flonase daily during allergy season Continue the Zyrtec Use the nasal saline spray as much as you like throughout the day  If You have severe shortness of breath I like for you to go to the emergency department

## 2019-11-25 ENCOUNTER — Ambulatory Visit: Attending: Family

## 2019-11-25 DIAGNOSIS — Z23 Encounter for immunization: Secondary | ICD-10-CM

## 2019-11-25 NOTE — Progress Notes (Signed)
   Covid-19 Vaccination [facility]  Name:  Charlotte Mcdaniel    MRN: [id number] DOB: 1996/02/18  11/25/2019  Charlotte Mcdaniel was observed post Covid-19 immunization for 15 minutes without incident. She was provided with Vaccine Information Sheet and instruction to access the V-Safe system.   Charlotte Mcdaniel was instructed to call 911 with any severe reactions post vaccine: Marland Kitchen Difficulty breathing  . Swelling of face and throat  . A fast heartbeat  . A bad rash all over body  . Dizziness and weakness   Immunizations Administered    Name Date Dose VIS Date Route   Moderna COVID-19 Vaccine 11/25/2019  3:31 PM 0.5 mL 07/2019 Intramuscular   Manufacturer: Moderna   Lot: [id number]   NDC: 47159-539-67
